# Patient Record
Sex: Male | Born: 1955 | Race: Black or African American | Hispanic: No | Marital: Married | Smoking: Former smoker
Health system: Southern US, Community
[De-identification: ages and names within clinical notes are randomized; demographics above are authoritative.]

## PROBLEM LIST (undated history)

## (undated) DIAGNOSIS — R55 Syncope and collapse: Secondary | ICD-10-CM

## (undated) DIAGNOSIS — J45909 Unspecified asthma, uncomplicated: Secondary | ICD-10-CM

---

## 2015-09-12 ENCOUNTER — Encounter: Payer: Self-pay | Admitting: Emergency Medicine

## 2015-09-12 ENCOUNTER — Emergency Department
Admission: EM | Admit: 2015-09-12 | Discharge: 2015-09-12 | Disposition: A | Discharge status: Home / Self Care | Payer: Managed Care, Other (non HMO) | Attending: Student | Admitting: Student

## 2015-09-12 ENCOUNTER — Emergency Department: Payer: Managed Care, Other (non HMO)

## 2015-09-12 DIAGNOSIS — J45909 Unspecified asthma, uncomplicated: Secondary | ICD-10-CM | POA: Insufficient documentation

## 2015-09-12 DIAGNOSIS — Z7951 Long term (current) use of inhaled steroids: Secondary | ICD-10-CM | POA: Diagnosis not present

## 2015-09-12 DIAGNOSIS — Z87891 Personal history of nicotine dependence: Secondary | ICD-10-CM | POA: Diagnosis not present

## 2015-09-12 DIAGNOSIS — R55 Syncope and collapse: Secondary | ICD-10-CM | POA: Diagnosis present

## 2015-09-12 HISTORY — DX: Syncope and collapse: R55

## 2015-09-12 HISTORY — DX: Unspecified asthma, uncomplicated: J45.909

## 2015-09-12 LAB — CBC WITH DIFFERENTIAL/PLATELET
BASOS ABS: 0 10*3/uL (ref 0–0.1)
BASOS PCT: 1 %
Eosinophils Absolute: 0 10*3/uL (ref 0–0.7)
Eosinophils Relative: 0 %
HEMATOCRIT: 41.1 % (ref 40.0–52.0)
HEMOGLOBIN: 13.9 g/dL (ref 13.0–18.0)
LYMPHS PCT: 21 %
Lymphs Abs: 1.2 10*3/uL (ref 1.0–3.6)
MCH: 30.9 pg (ref 26.0–34.0)
MCHC: 33.8 g/dL (ref 32.0–36.0)
MCV: 91.4 fL (ref 80.0–100.0)
MONO ABS: 0.5 10*3/uL (ref 0.2–1.0)
Monocytes Relative: 9 %
NEUTROS ABS: 4 10*3/uL (ref 1.4–6.5)
NEUTROS PCT: 69 %
Platelets: 148 10*3/uL — ABNORMAL LOW (ref 150–440)
RBC: 4.5 MIL/uL (ref 4.40–5.90)
RDW: 13.1 % (ref 11.5–14.5)
WBC: 5.8 10*3/uL (ref 3.8–10.6)

## 2015-09-12 LAB — COMPREHENSIVE METABOLIC PANEL
ALBUMIN: 3.1 g/dL — AB (ref 3.5–5.0)
ALK PHOS: 37 U/L — AB (ref 38–126)
ALT: 50 U/L (ref 17–63)
AST: 40 U/L (ref 15–41)
Anion gap: 5 (ref 5–15)
BILIRUBIN TOTAL: 1.1 mg/dL (ref 0.3–1.2)
BUN: 13 mg/dL (ref 6–20)
CO2: 21 mmol/L — ABNORMAL LOW (ref 22–32)
CREATININE: 1.16 mg/dL (ref 0.61–1.24)
Calcium: 7.2 mg/dL — ABNORMAL LOW (ref 8.9–10.3)
Chloride: 114 mmol/L — ABNORMAL HIGH (ref 101–111)
GFR calc Af Amer: >60 mL/min (ref >60)
GLUCOSE: 87 mg/dL (ref 65–99)
Potassium: 3.4 mmol/L — ABNORMAL LOW (ref 3.5–5.1)
Sodium: 140 mmol/L (ref 135–145)
TOTAL PROTEIN: 5.2 g/dL — AB (ref 6.5–8.1)

## 2015-09-12 LAB — TROPONIN I: Troponin I: <0.03 ng/mL (ref <0.03)

## 2015-09-12 MED ORDER — CALCIUM GLUCONATE 10 % IV SOLN
1.0000 g | Freq: Once | INTRAVENOUS | Status: AC
  Administered 2015-09-12: 1 g via INTRAVENOUS
  Filled 2015-09-12: qty 10

## 2015-09-12 MED ORDER — SODIUM CHLORIDE 0.9 % IV BOLUS (SEPSIS)
1000.0000 mL | Freq: Once | INTRAVENOUS | Status: AC
  Administered 2015-09-12: 1000 mL via INTRAVENOUS

## 2015-09-12 NOTE — ED Notes (Addendum)
[place] pt was helping to pushing a car then became lightheaded and sat down. Witnesses says he had LOC.   Pt reports he is currently being treated for previous episodes of syncope. CBG 98

## 2015-09-12 NOTE — ED Provider Notes (Addendum)
[facility] Emergency Department Provider Note   ____________________________________________  Time seen: Approximately 1:05 PM  I have reviewed the triage vital signs and the nursing notes.   HISTORY  Chief Complaint Loss of Consciousness    HPI Alan Krause is a 60 y.o. male with history of asthma as well as recurrent syncope on Midodrine [facility], single view is thought to be vasovagal in nature, who presents for evaluation of a syncopal episode which occurred earlier today, gradual onset, moderate, now resolved, no modifying factors. The patient reports that he was [place] rac[place], he was helping his friends push a vehicle when he began feeling lightheaded. He sat down at which time he fainted. He reports he knew that going to faint and this is happened to him several times, usually in the setting of position change, or working outside in the heat. He did not fall, hit his head or injure himself in any way. He reports he has been compliant with Midodrine. He denies any chest pain or difficulty breathing, no recent illness including no vomiting, diarrhea, fevers or chills.   Past Medical History  Diagnosis Date  . Asthma   . Syncope     There are no active problems to display for this patient.   History reviewed. No pertinent past surgical history.  Current Outpatient Rx  Name  Route  Sig  Dispense  Refill  . albuterol (PROAIR HFA) 108 (90 Base) MCG/ACT inhaler   Inhalation   Inhale 2 puffs into the lungs every 6 (six) hours as needed for wheezing or shortness of breath.         . midodrine (PROAMATINE) 5 MG tablet   Oral   Take 5 mg by mouth 3 (three) times daily.           Allergies Review of patient's allergies indicates no known allergies.  No family history on file.  Social History Social History  Substance Use Topics  . Smoking status: Former Games developermoker  . Smokeless tobacco: Never Used  . Alcohol  Use: No    Review of Systems Constitutional: No fever/chills Eyes: No visual changes. ENT: No sore throat. Cardiovascular: Denies chest pain. Respiratory: Denies shortness of breath. Gastrointestinal: No abdominal pain.  No nausea, no vomiting.  No diarrhea.  No constipation. Genitourinary: Negative for dysuria. Musculoskeletal: Negative for back pain. Skin: Negative for rash. Neurological: Negative for headaches, focal weakness or numbness.  10-point ROS otherwise negative.  ____________________________________________   PHYSICAL EXAM:  VITAL SIGNS: ED Triage Vitals  Enc Vitals Group     BP 09/12/15 1300 117/74 mmHg     Pulse Rate 09/12/15 1300 74     Resp 09/12/15 1300 20     Temp 09/12/15 1300 97.6 F (36.4 C)     Temp Source 09/12/15 1300 Oral     SpO2 09/12/15 1300 100 %     Weight 09/12/15 1300 257 lb (116.574 kg)     Height 09/12/15 1300 6\' 2"  (1.88 m)     Head Cir --      Peak Flow --      Pain Score --      Pain Loc --      Pain Edu? --      Excl. in GC? --     Constitutional: Alert and oriented. Well appearing and in no acute distress. Eyes: Conjunctivae are normal. PERRL. EOMI. Head: Atraumatic. Nose: No congestion/rhinnorhea. Mouth/Throat: Mucous membranes are moist.  Oropharynx non-erythematous. Neck: No stridor.   Cardiovascular: Normal rate, regular rhythm. Grossly normal heart sounds.  Good peripheral circulation. Respiratory: Normal respiratory effort.  No retractions. Lungs CTAB. Gastrointestinal: Soft and nontender. No distention. No CVA tenderness. Genitourinary: deferred Musculoskeletal: No lower extremity tenderness nor edema.  No joint effusions. Neurologic:  Normal speech and language. No gross focal neurologic deficits are appreciated. No gait instability. Skin:  Skin is warm, dry and intact. No rash noted. Psychiatric: Mood and affect are normal. Speech and behavior are normal.  ____________________________________________    LABS (all labs ordered are listed, but only abnormal results are displayed)  Labs Reviewed  CBC WITH DIFFERENTIAL/PLATELET - Abnormal; Notable for the following:    Platelets 148 (*)    All other components within normal limits  COMPREHENSIVE METABOLIC PANEL - Abnormal; Notable for the following:    Potassium 3.4 (*)    Chloride 114 (*)    CO2 21 (*)    Calcium 7.2 (*)    Total Protein 5.2 (*)    Albumin 3.1 (*)    Alkaline Phosphatase 37 (*)    All other components within normal limits  TROPONIN I  TROPONIN I   ____________________________________________  EKG  ED ECG REPORT I, Gayla Doss, the attending physician, personally viewed and interpreted this ECG.   Date: 09/12/2015  EKG Time: 13:06  Rate: 76  Rhythm: normal EKG, normal sinus rhythm  Axis: normal  Intervals:none  ST&T Change: No acute ST elevation or acute ST depression. RSR prime in V1 and V2. No STEMI, no Brugada, normal QTC.  ____________________________________________  RADIOLOGY  CXR IMPRESSION: No active disease.   ____________________________________________   PROCEDURES  Procedure(s) performed: None  Procedures  Critical Care performed: No  ____________________________________________   INITIAL IMPRESSION / ASSESSMENT AND PLAN / ED COURSE  Pertinent labs & imaging results that were available during my care of the patient were reviewed by me and considered in my medical decision making (see chart for details).  Alan Krause is a 60 y.o. male with history of asthma as well as recurrent syncope on Midrin followed by [facility] cardiology, single view is thought to be vasovagal in nature, who presents for evaluation of a likely vasovagal syncopal episode which occurred earlier today. 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 He is followed closely by [facility] cardiology. His syncopal episodes are not thought to be secondary to malignant arrhythmia. Currently he reports he feels very well. His vital signs are stable, he is afebrile, he is in no acute distress. He has an intact neurological examination. We'll obtain orthostatic vitals, check basic labs, chest x-ray, enzymes, observe in the emergency department. We'll provide IV fluids.  ----------------------------------------- 3:36 PM on 09/12/2015 ----------------------------------------- Patient reports he feels well at this time, is requesting discharge.  Initial troponin is negative, second troponin is pending. CMP is notable for mild hypocalcemia which corrects to 7.5, I will give a gram of calcium here in the ER. Unremarkable CBC with the exception of mild thrombocytopenia, platelet count is 148,000. I anticipate that if his second troponin is unremarkable and he continues to feel well, anticipate he can be discharged with close outpatient cardiology and primary care follow-up. Care transferred to Dr. Mayford Knife at this time pending troponin and final disposition. ____________________________________________   FINAL CLINICAL IMPRESSION(S) / ED DIAGNOSES  Final diagnoses:  Syncope, unspecified syncope type  Hypocalcemia      NEW MEDICATIONS STARTED DURING THIS VISIT:  New Prescriptions   No medications on file     Note:  This document was prepared using Dragon voice recognition software and may include unintentional dictation errors.    Gayla Doss, MD 09/12/15 1539  Gayla Doss, MD 09/12/15 [phone]

## 2015-09-12 NOTE — ED Provider Notes (Signed)
Patient is in no acute distress, is requesting to go home. Repeat troponin is unremarkable. He is advised to have close outpatient follow-up with his cardiologist. No clear etiology for his hypocalcemia  Emily FilbertJonathan E Livan Hires, MD 09/12/15 [id number]

## 2017-03-01 IMAGING — DX DG CHEST 1V PORT
1 series · 1 of 1 positions shown · non-contrast
Comparison: None.

CLINICAL DATA: 60-year-old male with a history of syncope

EXAM:
PORTABLE CHEST 1 VIEW

[chest ap]
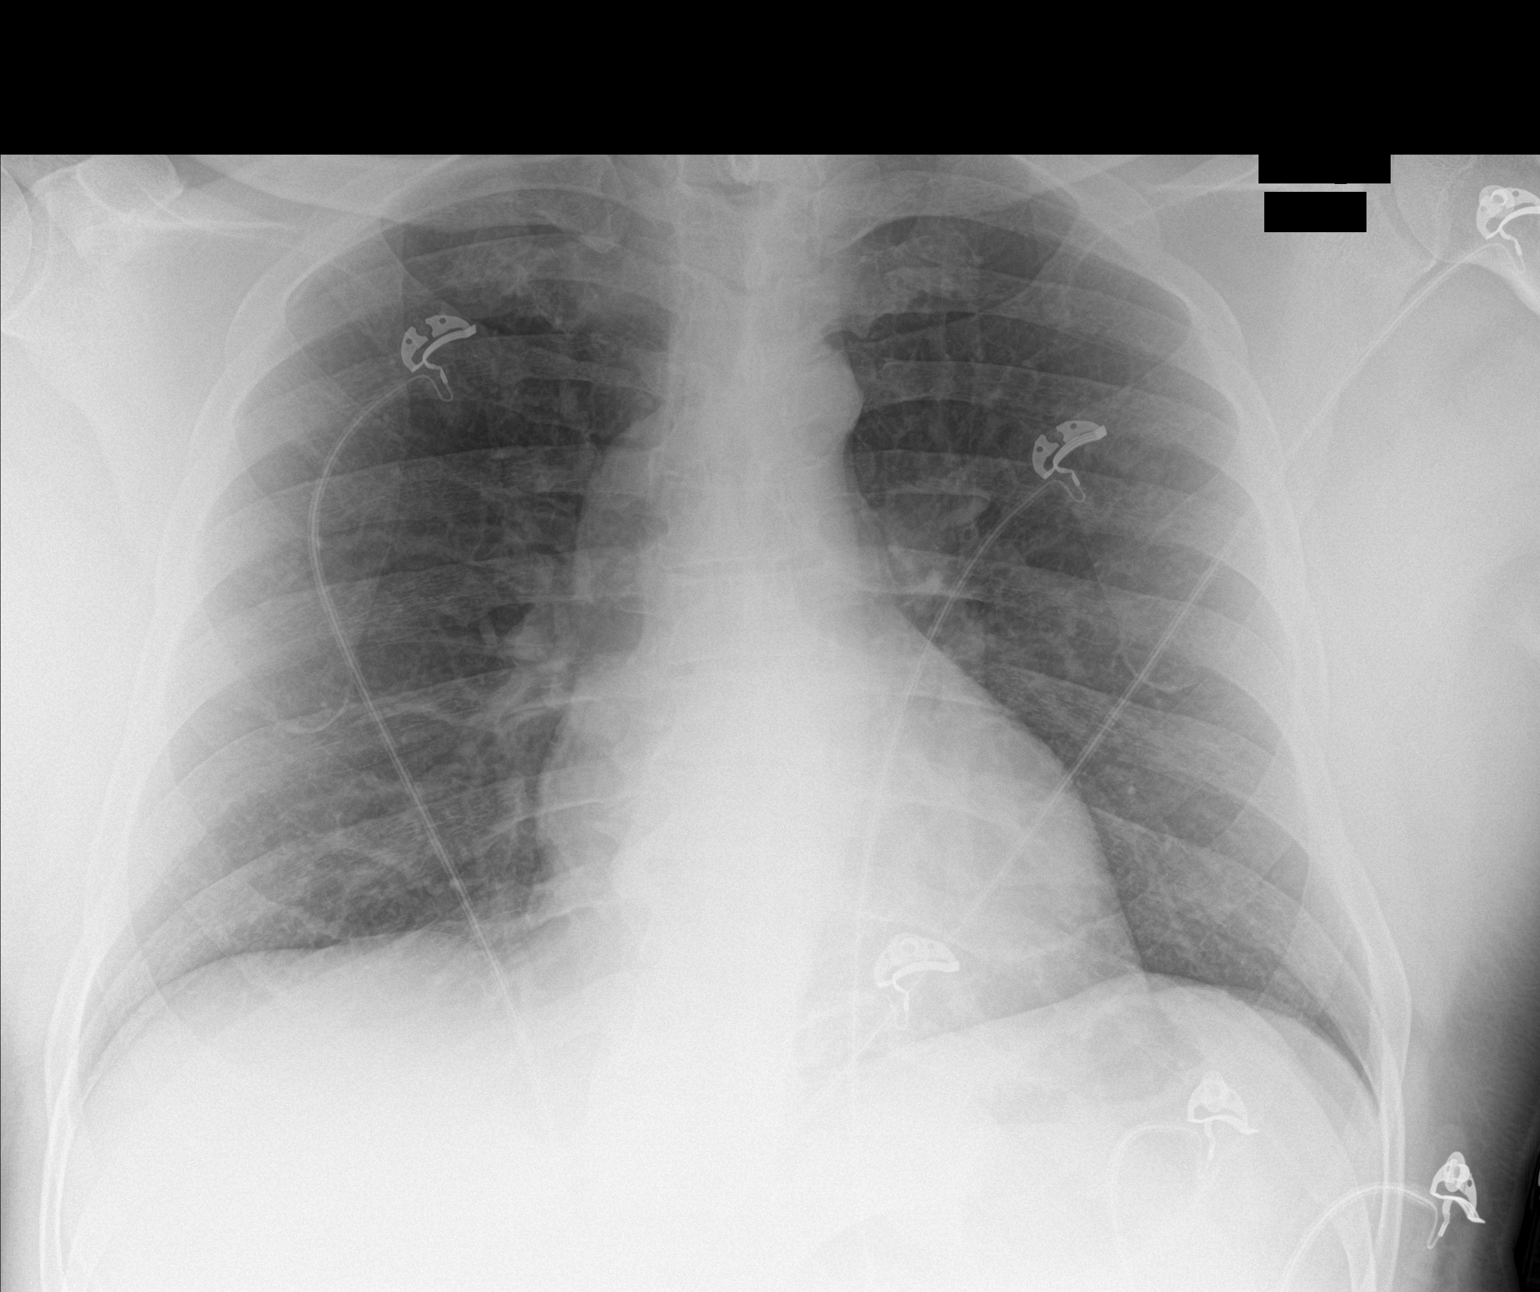

[1 of 1 positions shown; findings below may reference images not displayed]

FINDINGS: The heart size and mediastinal contours are within normal limits.
Both lungs are clear. The visualized skeletal structures are
unremarkable.
IMPRESSION: No active disease.
